# Patient Record
Sex: Male | Born: 1985 | Hispanic: Yes | Smoking: Never smoker
Health system: Southern US, Community
[De-identification: ages and names within clinical notes are randomized; demographics above are authoritative.]

---

## 2015-05-20 ENCOUNTER — Encounter (HOSPITAL_BASED_OUTPATIENT_CLINIC_OR_DEPARTMENT_OTHER): Payer: Self-pay

## 2015-05-20 ENCOUNTER — Emergency Department (HOSPITAL_BASED_OUTPATIENT_CLINIC_OR_DEPARTMENT_OTHER): Payer: Self-pay

## 2015-05-20 ENCOUNTER — Emergency Department (HOSPITAL_BASED_OUTPATIENT_CLINIC_OR_DEPARTMENT_OTHER)
Admission: EM | Admit: 2015-05-20 | Discharge: 2015-05-20 | Disposition: A | Discharge status: Home / Self Care | Payer: Self-pay | Attending: Emergency Medicine | Admitting: Emergency Medicine

## 2015-05-20 DIAGNOSIS — Y9289 Other specified places as the place of occurrence of the external cause: Secondary | ICD-10-CM | POA: Insufficient documentation

## 2015-05-20 DIAGNOSIS — Y9389 Activity, other specified: Secondary | ICD-10-CM | POA: Insufficient documentation

## 2015-05-20 DIAGNOSIS — Z23 Encounter for immunization: Secondary | ICD-10-CM | POA: Insufficient documentation

## 2015-05-20 DIAGNOSIS — W450XXA Nail entering through skin, initial encounter: Secondary | ICD-10-CM | POA: Insufficient documentation

## 2015-05-20 DIAGNOSIS — S91331A Puncture wound without foreign body, right foot, initial encounter: Secondary | ICD-10-CM | POA: Insufficient documentation

## 2015-05-20 DIAGNOSIS — Y998 Other external cause status: Secondary | ICD-10-CM | POA: Insufficient documentation

## 2015-05-20 MED ORDER — TETANUS-DIPHTH-ACELL PERTUSSIS 5-2.5-18.5 LF-MCG/0.5 IM SUSP
0.5000 mL | Freq: Once | INTRAMUSCULAR | Status: AC
  Administered 2015-05-20: 0.5 mL via INTRAMUSCULAR
  Filled 2015-05-20: qty 0.5

## 2015-05-20 MED ORDER — HYDROCODONE-ACETAMINOPHEN 5-325 MG PO TABS: 1.0000 | ORAL_TABLET | ORAL | Status: DC | PRN

## 2015-05-20 MED ORDER — CIPROFLOXACIN HCL 500 MG PO TABS: 500.0000 mg | ORAL_TABLET | Freq: Two times a day (BID) | ORAL | Status: DC

## 2015-05-20 MED ORDER — OXYCODONE-ACETAMINOPHEN 5-325 MG PO TABS
2.0000 | ORAL_TABLET | Freq: Once | ORAL | Status: AC
  Administered 2015-05-20: 2 via ORAL
  Filled 2015-05-20: qty 2

## 2015-05-20 NOTE — ED Notes (Signed)
Pt stepped on a nail at lunchtime that went through his work book on right foot.  Small puncture wound noted, no bleeding currently, pt states he is unable to walk on it.

## 2015-05-20 NOTE — Discharge Instructions (Signed)
1. Medications: cipro, vicodin, usual home medications 2. Treatment: rest, drink plenty of fluids, ice, elevate 3. Follow Up: please followup with your primary doctor for discussion of your diagnoses and further evaluation after today's visit; if you do not have a primary care doctor use the resource guide provided to find one; please return to the ER for severe pain, high fever, worsening swelling, redness, warmth, persistent vomiting   Herida por pinchadura  (Puncture Wound)  La pinchadura ocurre cuando un objeto punzante abre una herida en la piel. Esta herida puede causar una infeccin ya que los grmenes pueden ingresar debajo de la piel durante la lesin.  CUIDADOS EN EL HOGAR   Cambie el vendaje una vez por da o tal como se le indic. Si el vendaje se adhiere, remjelo con agua.  Cumpla con los controles mdicos segn las indicaciones.  Slo debe tomar la medicacin segn las indicaciones.  Tome los medicamentos (antibiticos) tal como se le indic. Tmelos todos, aunque se sienta mejor. Deber aplicarse la vacuna contra el ttanos si:  No recuerda cundo se coloc la vacuna la ltima vez.  Nunca recibi esta vacuna. Si usted necesita aplicarse la vacuna y se niega a recibirla, corre riesgo de contraer ttanos. sta es una enfermedad grave.  Si un Regulatory affairs officer la herida, debe recibir la vacuna contra la rabia.  SOLICITE AYUDA DE INMEDIATO SI:   La herida se ve roja, hinchada o le duele.  Observa lneas rojas [employer], cerca de la herida.  Advierte un olor ftido que proviene de la herida o del vendaje.  Observa una secrecin de color blanco amarillento (pus) en la herida.  El medicamento no le hace efecto.  Nota que hay un objeto en la herida.  Tiene fiebre.  Siente dolor intenso.  Tiene dificultad para respirar.  Se siente dbil o se desvanece (se desmaya).  Comienza avomitar.  Pierde la sensibilidad (adormecimiento) en el brazo, la pierna o no puede  mover el brazo ni la pierna.  Los sntomas empeoran. ASEGRESE DE QUE:   Comprende estas instrucciones.  Controlar su enfermedad.  Solicitar ayuda de inmediato si no mejora o si empeora. Document Released: 04/06/2011 Document Revised: 10/31/2011 [facility] Patient Information 2015 [place], [place]. This information is not intended to replace advice given to you by your health care provider. Make sure you discuss any questions you have with your health care provider.  Cuidados de una herida  (Wound Care)  El cuidado de la herida evita el dolor y la infeccin.  Deber aplicarse la vacuna contra el ttanos si:  No recuerda cundo se coloc la vacuna la ltima vez.  Nunca recibi esta vacuna.  La lesin ha [employer]. Si usted necesita aplicarse la vacuna y se niega a recibirla, corre riesgo de contraer ttanos. sta es una enfermedad grave.  CUIDADOS EN EL HOGAR   Slo tome la medicacin segn las indicaciones.  Limpie la herida [employer] con [place] y [place], segn las indicaciones.  Cambie el (vendaje) tal como se le indic.  Aplique una crema con medicamento sobre la herida, segn le indique el mdico.  Cmbielo si se moja, se ensucia o huele mal.  Puede tomar Shaune Spittle. No debe tomar baos de inmersin, nadar ni hacer nada que haga que la herida se encuentre debajo del agua.  Mantenga elevada y en reposo la zona lesionada hasta que el dolor y la hinchazn mejoren.  Cumpla con los controles mdicos segn las indicaciones. SOLICITE  AYUDA DE INMEDIATO SI:   Observa que un lquido blanco amarillento (pus) aparece en la zona de la herida.  Los medicamentos no Editor, commissioning.  Hay rayas rojas que salen de la herida.  Tiene fiebre. ASEGRESE DE QUE:   Comprende estas instrucciones.  Controlar su enfermedad.  Solicitar ayuda de inmediato si no mejora o si empeora. Document Released: 04/06/2011 Document Revised: 10/31/2011 [facility] Patient  Information 2015 [place], [place]. This information is not intended to replace advice given to you by your health care provider. Make sure you discuss any questions you have with your health care provider.   Emergency Department Resource Guide 1) Find a Doctor and Pay Out of Pocket Although you won't have to find out who is covered by your insurance plan, it is a good idea to ask around and get recommendations. You will then need to call the office and see if the doctor you have chosen will accept you as a new patient and what types of options they offer for patients who are self-pay. Some doctors offer discounts or will set up payment plans for their patients who do not have insurance, but you will need to ask so you aren't surprised when you get to your appointment.  2) Contact Your Local Health Department Not all health departments have doctors that can see patients for sick visits, but many do, so it is worth a call to see if yours does. If you don't know where your local health department is, you can check in your phone book. The CDC also has a tool to help you locate your state's health department, and many state websites also have listings of all of their local health departments.  3) Find a Walk-in Clinic If your illness is not likely to be very severe or complicated, you may want to try a walk in clinic. These are popping up all over the country in pharmacies, drugstores, and shopping centers. They're usually staffed by nurse practitioners or physician assistants that have been trained to treat common illnesses and complaints. They're usually fairly quick and inexpensive. However, if you have serious medical issues or chronic medical problems, these are probably not your best option.  No Primary Care Doctor: - Call Health Connect at  [phone] - they can help you locate a primary care doctor that  accepts your insurance, provides certain services, etc. - Physician Referral Service-  [phone]  Chronic Pain Problems: Organization         Address  Phone   Notes  [facility]  [phone] Patients need to be referred by their primary care doctor.   Medication Assistance: Organization         Address  Phone   Notes  [facility] Medication [facility] [address] [place] [place], [place] [address] [phone] --Must be a resident of [facility] -- Must have NO insurance coverage whatsoever (no Medicaid/ Medicare, etc.) -- The pt. MUST have a primary care doctor that directs their care regularly and follows them in the community   [facility]  [phone]   [employer]  [phone]    Agencies that provide inexpensive medical care: Organization         Address  Phone   Notes  [facility]  [phone]   [facility]    [phone]   [facility] [address] [place], [place] [address] [phone]   [facility] [place] [address] [place] [place] [phone]   Planned Parenthood    [phone]   [facility]    [phone]   [facility]  [address], [place] Phone:  [phone], Fax:  [phone] Hours of Operation:  9 am - 6 pm, M-F.  Also accepts Medicaid/Medicare and self-pay.  [facility]  [address], [place] Phone: [phone], Fax: [phone]. Hours of Operation:  8:30 am - 5:30 pm, M-F.  Also accepts Medicaid and self-pay.  [facility] [place] [address], [place] Phone: [phone]   [facility] [address] Natasha Bence [place], [place] [phone], Ext. [phone] Mondays & Thursdays: 7-9 AM.  First 15 patients are seen on a first come, first serve basis.    Medicaid-accepting [facility] Providers:  Organization         Address  Phone   Notes  [facility] [address],  [place] [phone] Also accepts self-pay patients.  [facility] [address] Laurell Josephs [place], [place]  [phone]   [facility] [address], [place] [phone]   [facility] [address], [place] [phone]   Renaye Rakers [address], [place]   [phone] Only accepts [place] Access [place] patients after they have their name applied to their card.   Self-Pay (no insurance) in [facility]:  Organization         Address  Phone   Notes  Sickle Cell Patients, [facility] Internal Medicine [address] [place], [place] [phone]   [facility] [address] [place], [place] [phone]   [facility] [place]  [address],  [phone]   [facility]/Dr. Osei-Bonsu  [address], [place] or [address], [place] [phone] Phone number for both [place] and [place] locations is the same.  [facility] [address], [place] [phone]   [facility] [address] [place] or [address] [phone]   [facility] [address], [place] [phone], phone; [phone], fax Sees patients 1st and 3rd Saturday of every month.  Must not qualify for public or private insurance (i.e. Medicaid, Medicare, [place] Health Choice, Veterans' Benefits)  Household income should be no more than 200% of the poverty level The clinic cannot treat you if you are pregnant or think you are pregnant  Sexually transmitted diseases are not treated at the clinic.    Dental Care: Organization         Address  Phone  Notes  [facility] Department of [facility] [address] [place], [place] [phone] Accepts children up to age 69 who are enrolled in  [place] or [place] Health Choice; pregnant women with a Medicaid card; and children who have applied for Medicaid or [place] Health Choice, but were declined, whose parents can pay a reduced fee at time of service.  [facility] of [facility]  [address], [place] [phone] Accepts children up to age 40 who are enrolled in [place] or [place] Health Choice; pregnant women with a Medicaid card; and children  who have applied for Medicaid or [place] Health Choice, but were declined, whose parents can pay a reduced fee at time of service.  [facility]  [address] [place], [place] [phone] Patients are seen by appointment only. Walk-ins are not accepted. [facility] will see patients 76 years of age and older. Monday - Tuesday (8am-5pm) Most Wednesdays (8:30-5pm) $30 per visit, cash only  [facility] Adult Dental Access PROGRAM  [address], [facility] [phone] Patients are seen by appointment only. Walk-ins are not accepted. [facility] will see patients 52 years of age and older. One Wednesday Evening (Monthly: Volunteer Based).  $30 per visit, cash only  [employer]  [phone] for adults; Children under age 30, call Graduate Pediatric Dentistry at [phone]. Children aged 98-14, please call [phone] to request a pediatric application.  Dental services are provided in all areas of dental care including fillings, crowns and bridges, complete and partial dentures, implants, gum treatment, root canals, and extractions. Preventive care is also provided. Treatment is provided to both adults and children. Patients are selected via a lottery and there is often a waiting list.   [facility] [address] [place]  [phone] [email or web address]   [facility] [address] [place], [place] [phone], Ext. [phone] Second and Fourth Thursday of each month, opens at 6:30  AM; Clinic ends at 9 AM.  Patients are seen on a first-come first-served basis, and a limited number are seen during each clinic.   [facility]  [address] Ether Griffins [place], [place] [phone]   Eligibility Requirements You must have lived in [place], [place], or [place] counties for at least the last three months.   You cannot be eligible for state or federal sponsored [place], including [employer], [place], or [employer].   You generally cannot be eligible for healthcare insurance through your employer.    How to apply: Eligibility screenings are held every Tuesday and Wednesday afternoon from 1:00 pm until 4:00 pm. You do not need an appointment for the interview!  [facility] [address], [place], [place] [phone]   [facility] Health Department  [phone]   [facility] Health Department  [phone]   [facility]  [phone]    Behavioral Health Resources in the Community: Intensive Outpatient Programs Organization         Address  Phone  Notes  [facility] Services [address] [place], [place] [phone]   [facility] Outpatient [address], [place], [place] [phone]   [facility]: [facility] [address], [place], [place]  [phone]   [facility] Mental Health [address]  [place], [place] [phone] or [phone]   Substance Abuse Resources Organization         Address  Phone  Notes  [facility] Drug Services  [phone]   [facility]  [phone]   [facility]  [phone]   Floydene Flock  [phone]   Residential & Outpatient Substance Abuse Program  [phone]   Psychological Services Organization         Address  Phone  Notes  [facility] Behavioral Health  [phone]   [facility]  [phone]   [facility] Mental Health [address] [place] [phone] or  [phone]    Mobile Crisis Teams Organization  Address  Phone  Notes  [facility], Mobile Crisis Care Unit  [phone]   [facility]  [address] [place], [place] [phone]   [facility] [address] [place] [phone]    Self-Help/Support Groups Organization         Address  Phone             Notes  Mental Health Assoc. of  - variety of support groups  336- I7437963 Call for more information  Narcotics Anonymous (NA), [facility] [address], [employer] [place]  2 meetings at this location   Statistician         Address  Phone  Notes  [facility] [address],    [place]  [phone]   [facility]  [address] [place] [address], [place], [place] [phone]   [facility] [address] [place], [place] [phone] Admissions: 8am-3pm M-F  [facility] [address]    [place], [place] [phone]   [facility] [address] [place], [place], [place] [phone]   [facility] [address]  [place], [place] [phone]   [facility] - Intensive Outpatient [address] Laurell Josephs [address], [place], [place] [phone]   [facility] [address] [place].,  [place], [place] [phone] or [phone]   [facility] ([facility]) [address] [place], [place] [phone] Accepts Medicaid  [facility] [place] [address]  [place] [phone] Substance Abuse/Addiction Treatment   [facility] Organization         Address  Phone  Notes  [facility]  [phone]   Angie Fava, PhD [address] Ervin Knack [place], [place]   [phone] or [phone]   [facility]   [address] [place], [place] [phone]   [facility] [address],  [place], [place] [phone] Insurance/Medicaid/sponsorship through [facility] and Families [address]                                    [place], [place] [phone] Therapy/tele-psych/case  [facility] [address] [place], [place] [phone]    Dr. Lolly Mustache  [phone]   [facility] [place] [facility]  [employer] [facility] Dept. 1) [address], [place] 2) [address], [place] 3)  [address], [place] [phone]  [phone]   [facility] Child Abuse Hotline 787-512-1246 or [phone] (After Hours)

## 2015-05-20 NOTE — ED Provider Notes (Signed)
CSN: [id number]     Arrival date & time 05/20/15  1910 History   First MD Initiated Contact with Patient 05/20/15 1954     Chief Complaint  Patient presents with  . Puncture Wound    HPI   Paul Conway is a 29 y.o. male with no significant past medical history who presents to the ED with puncture wound to his right foot. He states he stepped on a new, clean nail at lunch time, which went through his work boot and punctured his right foot. He reports initially, he did not have pain; however he states when he started to move around, he began to experience pain in his right foot. He also reports mild swelling. He denies fever, chills, chest pain, shortness of breath, abdominal pain, nausea, vomiting, numbness, weakness, paresthesia. He is unsure if his tetanus is up-to-date.   History reviewed. No pertinent past medical history. History reviewed. No pertinent past surgical history. No family history on file. Social History  Substance Use Topics  . Smoking status: None  . Smokeless tobacco: None  . Alcohol Use: None      Review of Systems  Constitutional: Negative for fever and chills.  Respiratory: Negative for shortness of breath.   Cardiovascular: Negative for chest pain.  Gastrointestinal: Negative for nausea, vomiting, abdominal pain and diarrhea.  Musculoskeletal: Positive for myalgias and gait problem.       Reports right foot pain. Reports difficulty walking due to pain. Reports decreased range of motion due to pain.  Skin: Positive for wound.  Neurological: Negative for dizziness, weakness, light-headedness, numbness and headaches.  All other systems reviewed and are negative.     Allergies  Review of patient's allergies indicates no known allergies.  Home Medications   Prior to Admission medications   Medication Sig Start Date End Date Taking? Authorizing Provider  ciprofloxacin (CIPRO) 500 MG tablet Take 1 tablet (500 mg total) by mouth 2 (two) times daily.  05/20/15   Mady Gemma, PA-C  HYDROcodone-acetaminophen (NORCO/VICODIN) 5-325 MG tablet Take 1 tablet by mouth every 4 (four) hours as needed. 05/20/15   Dorise Hiss [place], PA-C     BP 120/67 mmHg  Pulse 88  Temp(Src) 98.8 F (37.1 C) (Oral)  Resp 18  Ht  (1.778 m)  Wt 225 lb (102.059 kg)  BMI 32.28 kg/m2  SpO2 100% Physical Exam  Constitutional: He is oriented to person, place, and time. He appears well-developed and well-nourished. He appears distressed.  Patient in mild distress due to pain.  HENT:  Head: Normocephalic and atraumatic.  Right Ear: External ear normal.  Left Ear: External ear normal.  Nose: Nose normal.  Mouth/Throat: Oropharynx is clear and moist.  Eyes: Conjunctivae and EOM are normal. Pupils are equal, round, and reactive to light. Right eye exhibits no discharge. Left eye exhibits no discharge. No scleral icterus.  Neck: Normal range of motion. Neck supple.  Cardiovascular: Normal rate, regular rhythm and intact distal pulses.   Pulmonary/Chest: Effort normal and breath sounds normal.  Musculoskeletal: Normal range of motion. He exhibits edema and tenderness.  Small puncture wound to plantar aspect of right lateral foot. Mild edema and erythema to dorsal aspect of right lateral foot. Tenderness to palpation of plantar aspect of right lateral foot and dorsal aspect of right lateral foot. Full range of motion of right lower extremity. Strength and sensation intact. Distal pulses intact. Cap refill <3 s.  Neurological: He is alert and oriented to person, place, and time.  He has normal strength. No sensory deficit.  Skin: Skin is warm and dry. No rash noted. He is not diaphoretic. There is erythema. No pallor.  Psychiatric: He has a normal mood and affect. His behavior is normal. Judgment and thought content normal.  Nursing note and vitals reviewed.   ED Course  Procedures (including critical care time)  Labs Review Labs Reviewed - No data to  display  Imaging Review Dg Foot Complete Right  05/20/2015   CLINICAL DATA:  Stepped on nail. Plantar foot puncture wound. Pain. Initial encounter.  EXAM: RIGHT FOOT COMPLETE - 3+ VIEW  COMPARISON:  None.  FINDINGS: There is no evidence of fracture or dislocation. There is no evidence of arthropathy or other focal bone abnormality. Soft tissues are unremarkable. No evidence of radiopaque foreign body.  IMPRESSION: Negative.   Electronically Signed   By: Myles Rosenthal M.D.   On: 05/20/2015 19:56   I have personally reviewed and evaluated these images as part of my medical decision-making.   EKG Interpretation None      MDM   Final diagnoses:  Puncture wound of foot, right, initial encounter    29 year old male presents with puncture wound to the plantar aspect of his right foot after stepping on a new, clean nail around lunchtime today. He reports pain, swelling, and difficulty walking. He denies numbness, weakness, paresthesia, fever, chills.  Patient is afebrile with stable vital signs. Small puncture wound to plantar aspect of right lateral foot, hemostatic. Mild edema and erythema to dorsal aspect of right lateral foot. TTP of plantar and dorsal aspect of right lateral foot. Full range of motion. Strength and sensation intact. Distal pulses intact. Cap refill <3 seconds.  Tetanus updated in the ED. Wound cleaned and dressed. Pain controlled with percocet. Feel patient is stable for discharge at this time. Will treat with ciprofloxacin for infection prophylaxis and pain medicine. Post-op shoe given for support while ambulating. Patient to follow-up for wound re-check in 2 days. Return precautions discussed at length. Patient in agreement with plan.  BP 120/67 mmHg  Pulse 88  Temp(Src) 98.8 F (37.1 C) (Oral)  Resp 18  Ht  (1.778 m)  Wt 225 lb (102.059 kg)  BMI 32.28 kg/m2  SpO2 100%     Mady Gemma, PA-C 05/21/15 [address] [place], [place] 05/21/15  1045  Arby Barrette, MD 06/03/15 [phone]

## 2015-06-20 ENCOUNTER — Emergency Department (HOSPITAL_BASED_OUTPATIENT_CLINIC_OR_DEPARTMENT_OTHER)
Admission: EM | Admit: 2015-06-20 | Discharge: 2015-06-20 | Disposition: A | Discharge status: Home / Self Care | Payer: Self-pay | Attending: Emergency Medicine | Admitting: Emergency Medicine

## 2015-06-20 ENCOUNTER — Encounter (HOSPITAL_BASED_OUTPATIENT_CLINIC_OR_DEPARTMENT_OTHER): Payer: Self-pay | Admitting: *Deleted

## 2015-06-20 DIAGNOSIS — R079 Chest pain, unspecified: Secondary | ICD-10-CM | POA: Insufficient documentation

## 2015-06-20 DIAGNOSIS — Z792 Long term (current) use of antibiotics: Secondary | ICD-10-CM | POA: Insufficient documentation

## 2015-06-20 DIAGNOSIS — K625 Hemorrhage of anus and rectum: Secondary | ICD-10-CM | POA: Insufficient documentation

## 2015-06-20 LAB — CBC WITH DIFFERENTIAL/PLATELET
BASOS ABS: 0 10*3/uL (ref 0.0–0.1)
Basophils Relative: 0 %
EOS PCT: 4 %
Eosinophils Absolute: 0.3 10*3/uL (ref 0.0–0.7)
HCT: 41.3 % (ref 39.0–52.0)
HEMOGLOBIN: 13.7 g/dL (ref 13.0–17.0)
LYMPHS ABS: 2.2 10*3/uL (ref 0.7–4.0)
LYMPHS PCT: 26 %
MCH: 27.8 pg (ref 26.0–34.0)
MCHC: 33.2 g/dL (ref 30.0–36.0)
MCV: 83.9 fL (ref 78.0–100.0)
MONO ABS: 1 10*3/uL (ref 0.1–1.0)
MONOS PCT: 12 %
NEUTROS ABS: 5 10*3/uL (ref 1.7–7.7)
Neutrophils Relative %: 59 %
PLATELETS: 191 10*3/uL (ref 150–400)
RBC: 4.92 MIL/uL (ref 4.22–5.81)
RDW: 13.9 % (ref 11.5–15.5)
WBC: 8.6 10*3/uL (ref 4.0–10.5)

## 2015-06-20 LAB — OCCULT BLOOD X 1 CARD TO LAB, STOOL: FECAL OCCULT BLD: NEGATIVE

## 2015-06-20 MED ORDER — DOCUSATE SODIUM 100 MG PO CAPS: 100.0000 mg | ORAL_CAPSULE | Freq: Two times a day (BID) | ORAL | Status: DC

## 2015-06-20 NOTE — ED Provider Notes (Signed)
CSN: [id number]     Arrival date & time 06/20/15  1312 History   First MD Initiated Contact with Patient 06/20/15 1329     Chief Complaint  Patient presents with  . Rectal Bleeding     (Consider location/radiation/quality/duration/timing/severity/associated sxs/prior Treatment) The history is provided by the patient and a relative. The history is limited by a language barrier. A language interpreter was used.     Sriansh Farra is a 29 y.o. male, pt with no pertinent past medical history, presents with scant bright red blood after wiping following bowel movements for the past two weeks. Pain with bleeding, burning/itching, 4/10, localized to rectum, and non-radiating. Denies new medications, N/V/C/D, fever/chills, abdominal pain, or any other pain or complaints.  Girlfriend, Marylene Land, at bedside acted as Nurse, learning disability.     History reviewed. No pertinent past medical history. History reviewed. No pertinent past surgical history. No family history on file. Social History  Substance Use Topics  . Smoking status: Never Smoker   . Smokeless tobacco: Never Used  . Alcohol Use: Yes     Comment: 7 beers/ daily    Review of Systems  Constitutional: Negative for fever, chills, diaphoresis and unexpected weight change.  Respiratory: Negative for cough, chest tightness and shortness of breath.   Cardiovascular: Positive for chest pain. Negative for palpitations and leg swelling.  Gastrointestinal: Negative for nausea, vomiting, abdominal pain, diarrhea and constipation.  Genitourinary: Negative for dysuria and flank pain.  Musculoskeletal: Negative for back pain.  Skin: Negative for color change and pallor.  Neurological: Negative for dizziness, syncope, weakness and light-headedness.  All other systems reviewed and are negative.     Allergies  Review of patient's allergies indicates no known allergies.  Home Medications   Prior to Admission medications   Medication Sig Start Date End  Date Taking? Authorizing Provider  ciprofloxacin (CIPRO) 500 MG tablet Take 1 tablet (500 mg total) by mouth 2 (two) times daily. 05/20/15   Mady Gemma, PA-C  docusate sodium (COLACE) 100 MG capsule Take 1 capsule (100 mg total) by mouth every 12 (twelve) hours. 06/20/15   Shawn C Joy, PA-C  HYDROcodone-acetaminophen (NORCO/VICODIN) 5-325 MG tablet Take 1 tablet by mouth every 4 (four) hours as needed. 05/20/15   Mady Gemma, PA-C   BP 122/71 mmHg  Pulse 71  Temp(Src) 98 F (36.7 C) (Oral)  Resp 18  Ht  (1.702 m)  Wt 242 lb 2 oz (109.827 kg)  BMI 37.91 kg/m2  SpO2 100% Physical Exam  Constitutional: He appears well-developed and well-nourished. No distress.  HENT:  Head: Normocephalic and atraumatic.  Eyes: Conjunctivae are normal. Pupils are equal, round, and reactive to light.  Cardiovascular: Normal rate, regular rhythm and normal heart sounds.   Pulmonary/Chest: Effort normal and breath sounds normal. No respiratory distress.  Abdominal: Soft. Bowel sounds are normal.  Genitourinary: Prostate normal. Rectal exam shows tenderness. Rectal exam shows no external hemorrhoid, no mass and anal tone normal.  RN acted as chaperone for rectal exam. Pt exhibited tenderness to palpation during exam, but states it was more uncomfortable than really painful. No gross blood or stool. Erythema present to anus and surrounding tissue.   Musculoskeletal: He exhibits no edema or tenderness.  Neurological: He is alert.  Skin: Skin is warm and dry. He is not diaphoretic.  Nursing note and vitals reviewed.   ED Course  Procedures (including critical care time) Labs Review Labs Reviewed  CBC WITH DIFFERENTIAL/PLATELET  OCCULT BLOOD X 1 CARD  TO LAB, STOOL    Imaging Review No results found. I have personally reviewed and evaluated these images and lab results as part of my medical decision-making.   EKG Interpretation None      MDM   Final diagnoses:  Bright red  rectal bleeding    Domingo MadeiraReynaldo Sappenfield presents with scant rectal bleeding and pain for the past two weeks with no accompanying symptoms.   Pt shows no signs or symptoms that would indicate imaging at this time. Recommend pt follow up with a PCP on this matter. Pt given colace prescription upon discharge. Communicated plan of care to pt, who agreed to the plan. Hemocult negative. CBC without abnormalities.   Anselm PancoastShawn C Joy, PA-C 06/20/15 1452  Pricilla LovelessScott Goldston, MD 06/21/15 [phone]

## 2015-06-20 NOTE — ED Notes (Signed)
Patient stable and ambulatory.  Patient verbalizes understanding of discharge instructions and medications. 

## 2015-06-20 NOTE — ED Notes (Signed)
Pt reports BRB rectal bleeding with BM x 1week- rectal pain x 2 weeks

## 2015-06-20 NOTE — Discharge Instructions (Signed)
You have been seen today for rectal bleeding and pain. Your lab tests showed no abnormalities. Follow up with PCP on this matter. A resource guide is attached to help you choose a PCP. Return to ED should symptoms worsen.   Emergency Department Resource Guide 1) Find a Doctor and Pay Out of Pocket Although you won't have to find out who is covered by your insurance plan, it is a good idea to ask around and get recommendations. You will then need to call the office and see if the doctor you have chosen will accept you as a new patient and what types of options they offer for patients who are self-pay. Some doctors offer discounts or will set up payment plans for their patients who do not have insurance, but you will need to ask so you aren't surprised when you get to your appointment.  2) Contact Your Local Health Department Not all health departments have doctors that can see patients for sick visits, but many do, so it is worth a call to see if yours does. If you don't know where your local health department is, you can check in your phone book. The CDC also has a tool to help you locate your state's health department, and many state websites also have listings of all of their local health departments.  3) Find a Walk-in Clinic If your illness is not likely to be very severe or complicated, you may want to try a walk in clinic. These are popping up all over the country in pharmacies, drugstores, and shopping centers. They're usually staffed by nurse practitioners or physician assistants that have been trained to treat common illnesses and complaints. They're usually fairly quick and inexpensive. However, if you have serious medical issues or chronic medical problems, these are probably not your best option.  No Primary Care Doctor: - Call Health Connect at  [phone] - they can help you locate a primary care doctor that  accepts your insurance, provides certain services, etc. - Physician Referral  Service- [phone]  Chronic Pain Problems: Organization         Address  Phone   Notes  [facility]  [phone]) [phone] Patients need to be referred by their primary care doctor.   Medication Assistance: Organization         Address  Phone   Notes  [facility] Medication [facility] [address] [place], [place] [address] [phone]) [phone] --Must be a resident of [facility] [place] -- Must have NO insurance coverage whatsoever (no Medicaid/ Medicare, etc.) -- The pt. MUST have a primary care doctor that directs their care regularly and follows them in the community   MedAssist  [phone]) [phone]   [employer]  [phone]    Agencies that provide inexpensive medical care: Organization         Address  Phone   Notes  [facility]  [phone]   [facility]    [phone]   [facility] [address] [place], [place] [address] [phone]) [phone]   [facility] [address], [place] [phone]) [phone]   [facility]anned Parenthood    [phone]) [phone]   [facility]    [phone]) [phone]   [facility] and [facility]  [address], [place] Phone:  [phone], Fax:  [phone] Hours of Operation:  9 am - 6 pm, M-F.  Also accepts Medicaid/Medicare and  self-pay.  [facility]  [address], [place] Phone: [phone], Fax: [phone]) [phone]. Hours of Operation:  8:30 am - 5:30 pm, M-F.  Also accepts Medicaid and self-pay.  [facility] [place] [address], [place] Phone: [phone]) [phone]   [facility] [address], [place], [place] [phone], Ext. [phone] Mondays & Thursdays: 7-9 AM.  First 15 patients are seen on a first come, first serve basis.    Medicaid-accepting [facility] Providers:  Organization         Address  Phone   Notes  [facility] [address], [place] [phone]) [phone] Also accepts self-pay patients.  [facility] [address], [place]  [phone]) [phone]   [facility] [address], [place] [phone]) [phone]   [facility] [address] [place] [address], [place] [phone]) [phone]   Renaye RakersVeita Bland [address], [place]   [phone]) [phone] Only accepts [place] Access [place] patients after they have their name applied to their card.   Self-Pay (no insurance) in [facility]:  Organization         Address  Phone   Notes  Sickle Cell Patients, [facility] [address], [place] [phone]   [facility] [address], [place] [phone]   [facility]  [address], [place] [phone]) [phone]   [facility]/Dr. Osei-Bonsu  [address] [place] [address], [place] or [phone], [place] [phone]) [phone] Phone number for both [place] and [place] locations is the same.  [facility] [address], [place] [phone]) [phone]   [facility] [place] [address] [place] [address], [place] or [address] [phone]) [phone]) [phone]   [facility] [address], [place] [phone](336) [phone], phone; [phone]) [phone], fax Sees patients 1st and 3rd Saturday of every month.  Must not qualify for public or private insurance (i.e. Medicaid, Medicare, [place] Health Choice, Veterans' Benefits)  Household income should be no more than 200% of the poverty level The clinic cannot treat you if you are pregnant or think you are pregnant  Sexually transmitted diseases are not treated at the clinic.    Dental Care: Organization         Address  Phone  Notes  [facility] Department of [facility] [address], [place] [phone] Accepts children up to age 31 who are enrolled in  [place] or [place] Health Choice; pregnant women with a Medicaid card; and children who have applied for Medicaid or [place] Health Choice, but were declined, whose parents can pay a reduced fee at time of service.  [facility] Department of [facility] [place]  [address], [place] [phone]) [phone] Accepts children up to age 31 who are enrolled in [place] or [place] Health Choice; pregnant women with a Medicaid card; and children who have applied for Medicaid or  Health Choice, but were declined, whose parents can pay a reduced fee at time of service.  [facility]  [address], [place] [phone] Patients are seen by appointment only. Walk-ins are not accepted. [facility] will see patients 31 years of age and older. Monday - Tuesday (8am-5pm) Most Wednesdays (8:30-5pm) $  30 per visit, cash only  [facility] [employer] PROGRAM  [address], [facility] [phone] Patients are seen by appointment only. Walk-ins are not accepted. [place] will see patients 66 years of age and older. One Wednesday Evening (Monthly: Volunteer Based).  $30 per visit, cash only  [place]  [phone] for adults; Children under age 85, call Graduate Pediatric Dentistry at [phone]. Children aged 31-14, please call [phone] to request a pediatric application.  Dental services are provided in all areas of dental care including fillings, crowns and bridges, complete and partial dentures, implants, gum treatment, root canals, and extractions. Preventive care is also provided. Treatment is provided to both adults and children. Patients are selected via a lottery and there is often a waiting list.   [facility] [address]  [phone] [email or web address]   [facility] [address] [place], [place] [phone], Ext. [phone] Second and Fourth Thursday of each month, opens at 6:30  AM; Clinic ends at 9 AM.  Patients are seen on a first-come first-served basis, and a limited number are seen during each clinic.   [facility]  [address] Danker [place], [place] [phone]   Eligibility Requirements You must have lived in [place], [place], or [place] counties for at least the last three months.   You cannot be eligible for state or federal sponsored [employer], including [employer], [place], or [employer].   You generally cannot be eligible for healthcare insurance through your employer.    How to apply: Eligibility screenings are held every Tuesday and Wednesday afternoon from 1:00 pm until 4:00 pm. You do not need an appointment for the interview!  [facility] [address] [place], [place]   [place] Department  [place]  [phone]    Behavioral Health Resources in the Community: Intensive Outpatient Programs Organization         Address  Phone  Notes  [place]. [address] [place], [place] [phone]   [facility] [address] [place], [place]   ADS: Alcohol & Drug Svcs [address], [place], [place]   [place] [address],  [place], [place] or [phone]   Substance Abuse Resources Organization         Address  Phone  Notes  Alcohol and Drug Services  [phone]   [place]  [phone]   [place]   Chinita Pester  [phone]   Residential & Outpatient Substance Abuse Program  [phone]   Psychological Services Organization         Address  Phone  Notes  [facility] [place]  [place]  [phone]   [place] [address] [place] or  [phone]    Mobile Crisis Teams Organization         Address  Phone  Notes  [facility]rapeutic Alternatives, Mobile Crisis Care Unit  [phone]   Assertive Psychotherapeutic Services  [address] [place], [place]   [place] Levels [address], [place] [phone]    Self-Help/Support Groups Organization         Address  Phone  Notes  Mental Health Assoc. of [place] - variety of support groups  [place] Call for more information  Narcotics Anonymous (NA), Caring [facility] [address], [employer] [place]  2 meetings at this location   Special educational needs teacher         Address  Phone  Notes  ASAP Residential Treatment [place],    [place]  [phone]   [facility]  [address] [place] [id number], [place], [place]   [place], [place] [phone] Admissions: 8am-3pm M-F  Incentives Substance [place] [address]    [place], [place] [phone]   [facility] [address] [place], [place], [place]   [facility] [address],  [place], [place]   Insight Programs - Intensive Outpatient [address], [place], [place]   [facility] ([place] [address].,  [place], [place] [phone] or [phone]   [facility]idential Treatment Services (RTS) [address] [place], [place] Accepts Medicaid  [facility] [place] [address],  [place] [phone] Substance Abuse/Addiction Treatment   [facility] Organization         Address  Phone  Notes  [facility]  [phone]   Domenic Schwab, PhD [address]lis Porta [place], [place]   [phone] or [phone]   [place] and [place], [place] [phone]   [facility] [address],  [place], [place] [phone] Insurance/Medicaid/sponsorship through [facility] and Families [address] [place] [address], [address] [place]                                    [place], [place] [phone] [place] [address], [place] [phone]    Dr. Adele Schilder  [phone]   [facility] Dept. 1) [address], [place] 2) [place] 3)  [place] [address], [place] [phone]  [phone]   [place] [phone] or [phone] (After Hours)

## 2015-07-20 ENCOUNTER — Encounter (HOSPITAL_BASED_OUTPATIENT_CLINIC_OR_DEPARTMENT_OTHER): Payer: Self-pay

## 2015-07-20 ENCOUNTER — Emergency Department (HOSPITAL_BASED_OUTPATIENT_CLINIC_OR_DEPARTMENT_OTHER)
Admission: EM | Admit: 2015-07-20 | Discharge: 2015-07-21 | Disposition: A | Discharge status: Home / Self Care | Payer: Self-pay | Attending: Emergency Medicine | Admitting: Emergency Medicine

## 2015-07-20 DIAGNOSIS — H00016 Hordeolum externum left eye, unspecified eyelid: Secondary | ICD-10-CM

## 2015-07-20 DIAGNOSIS — H00015 Hordeolum externum left lower eyelid: Secondary | ICD-10-CM | POA: Insufficient documentation

## 2015-07-20 MED ORDER — ERYTHROMYCIN 5 MG/GM OP OINT: TOPICAL_OINTMENT | OPHTHALMIC | Status: DC

## 2015-07-20 NOTE — ED Provider Notes (Signed)
CSN: [id number]     Arrival date & time 07/20/15  1914 History   First MD Initiated Contact with Patient 07/20/15 2259     Chief Complaint  Patient presents with  . Eye Problem     (Consider location/radiation/quality/duration/timing/severity/associated sxs/prior Treatment) HPI   Domingo MadeiraReynaldo Gebhardt is a 29 y.o. male, patient with no pertinent past medical history, presenting to the ED with two weeks of what he calls a stye on his left lower eye lid. Pt wanted to make sure he didn't have something more wrong. Denies vision changes. Pt states his pain is minor, throbbing, 1/10, non-radiating. Patient has tried some warm compresses intermittently with some relief of discomfort. Patient's girlfriend is at the bedside and translates for the patient.    History reviewed. No pertinent past medical history. History reviewed. No pertinent past surgical history. No family history on file. Social History  Substance Use Topics  . Smoking status: Never Smoker   . Smokeless tobacco: Never Used  . Alcohol Use: Yes     Comment: 7 beers/ daily    Review of Systems  Eyes: Positive for pain.      Allergies  Review of patient's allergies indicates no known allergies.  Home Medications   Prior to Admission medications   Medication Sig Start Date End Date Taking? Authorizing Provider  erythromycin ophthalmic ointment Place a 1/2 inch ribbon of ointment into the lower eyelid. 07/20/15   Kieren Adkison C Zerina Hallinan, PA-C   BP 123/79 mmHg  Pulse 83  Temp(Src) 98.2 F (36.8 C) (Oral)  Resp 18  Ht 5\' 8"  (1.727 m)  Wt 109.77 kg  BMI 36.80 kg/m2  SpO2 98% Physical Exam  Constitutional: He appears well-developed and well-nourished.  HENT:  Head: Normocephalic and atraumatic.  Eyes: Conjunctivae and EOM are normal. Pupils are equal, round, and reactive to light.  Patient has some minor swelling to the medial side of the lower lid. No fluctuance. The tear duct appears patent. Globe is normal.    Visual  Acuity  Right Eye Distance:   Left Eye Distance:   Bilateral Distance:    Right Eye Near: R Near: 20/25 Left Eye Near:  L Near: 20/20 Bilateral Near:  20/20     ED Course  Procedures (including critical care time) Labs Review Labs Reviewed - No data to display  Imaging Review No results found. I have personally reviewed and evaluated these images and lab results as part of my medical decision-making.   EKG Interpretation None      MDM   Final diagnoses:  Stye, left    Domingo MadeiraReynaldo Rathert presents with an area of swelling of the lower left eyelid.  Findings and plan of care discussed with Vanetta MuldersScott Zackowski, MD.  This is a hordeolum and the treatment is warm compresses. Patient will be getting the erythromycin ointment as well. If this is not better in a week patient will follow up with ophthalmology. These instructions and the plan of care were explained to the patient, who voiced understanding and agreed to the plan.  Anselm PancoastShawn C Lennan Malone, PA-C 07/21/15 1652

## 2015-07-20 NOTE — ED Provider Notes (Signed)
Medical screening examination/treatment/procedure(s) were conducted as a shared visit with non-physician practitioner(s) and myself.  I personally evaluated the patient during the encounter.   EKG Interpretation None      Patient seen by me. 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 Patient nontoxic no acute distress. Will continue warm compresses will use erythromycin ophthalmic ointment and will give referral information to ophthalmology. Patient does not need to follow-up ophthalmology right away can give this another week but referral information provided. Sclera otherwise without any significant abnormalities anterior chambers normal pupils are equal and reactive to light extraocular muscles are intact. Chalazion is on the left lower lid towards the medial aspect measures about 5 mm in size.  Vanetta MuldersScott Magaby Rumberger, MD 07/20/15 [phone]

## 2015-07-20 NOTE — ED Notes (Addendum)
C/o stye area to left lower lid x approx 2 weeks-started after taking insulation out of home-NAD-pt speaks some english-girlfriend interpreting

## 2015-07-20 NOTE — Discharge Instructions (Signed)
You have been seen today for a stye. Follow up with PCP as needed. Return to ED should symptoms worsen. Apply warm compresses to the area as well as the erythromycin ointment for one week. If this is not improved, follow-up with ophthalmology.   Emergency Department Resource Guide 1) Find a Doctor and Pay Out of Pocket Although you won't have to find out who is covered by your insurance plan, it is a good idea to ask around and get recommendations. You will then need to call the office and see if the doctor you have chosen will accept you as a new patient and what types of options they offer for patients who are self-pay. Some doctors offer discounts or will set up payment plans for their patients who do not have insurance, but you will need to ask so you aren't surprised when you get to your appointment.  2) Contact Your Local Health Department Not all health departments have doctors that can see patients for sick visits, but many do, so it is worth a call to see if yours does. If you don't know where your local health department is, you can check in your phone book. The CDC also has a tool to help you locate your state's health department, and many state websites also have listings of all of their local health departments.  3) Find a Walk-in Clinic If your illness is not likely to be very severe or complicated, you may want to try a walk in clinic. These are popping up all over the country in pharmacies, drugstores, and shopping centers. They're usually staffed by nurse practitioners or physician assistants that have been trained to treat common illnesses and complaints. They're usually fairly quick and inexpensive. However, if you have serious medical issues or chronic medical problems, these are probably not your best option.  No Primary Care Doctor: - Call Health Connect at  [phone] - they can help you locate a primary care doctor that  accepts your insurance, provides certain services,  etc. - Physician Referral Service- [phone]  Chronic Pain Problems: Organization         Address  Phone   Notes  [facility]  [phone]) [phone] Patients need to be referred by their primary care doctor.   Medication Assistance: Organization         Address  Phone   Notes  [facility] [address] [place], [place] [address] [phone]) [phone] --Must be a resident of [place] -- Must have NO insurance coverage whatsoever (no Medicaid/ Medicare, etc.) -- The pt. MUST have a primary care doctor that directs their care regularly and follows them in the community   MedAssist  [phone]   [employer]  [phone]    Agencies that provide inexpensive medical care: Organization         Address  Phone   Notes  [facility]  [phone]   [facility]    [phone]   [facility] [address] [place], [place] [address] [phone]   [facility] [address], [place] [phone]   [facility]anned [facility]    [phone](336) [phone]   [facility]    [phone]   [facility]  [address], [place] Phone:  [phone]) [phone], Fax:  [phone] Hours of Operation:  9 am - 6 pm, M-F.  Also  accepts Medicaid/Medicare and self-pay.  [facility] for [place], [address], [place] Phone: [phone], Fax: [phone]. Hours of Operation:  8:30 am - 5:30 pm, M-F.  Also accepts Medicaid and self-pay.  [facility] [place] [address] [place] Phone: [phone]   [place], [place], [place] [phone], Ext. [phone] Mondays & Thursdays: 7-9 AM.  First 15 patients are seen on a first come, first serve basis.    [place] Providers:  Organization         Address  Phone   Notes  [facility] [address], [place] [phone] Also accepts self-pay patients.  [facility] [address], [place]  [phone]   [place], [address], [place] [phone]   [facility] [address] [place] [phone]   Lucianne Lei [address], [place]   [phone] Only accepts [place] Access [place] patients after they have their name applied to their card.   Self-Pay (no insurance) in [facility]:  Organization         Address  Phone   Notes  Sickle Cell Patients, [facility] [place] [phone]   [facility] [place] [phone]   [facility] [place]  [place], [place], [place] [phone]   [facility]/Dr. Osei-Bonsu  [address], [place] or [address], [place] [phone] Phone number for both [place] and [place] locations is the same.  [facility] [address], [place] [phone]   [facility] [address] [place] or [address] [phone]   [facility] [address], [place] [phone], phone; [phone], fax Sees patients 1st and 3rd Saturday of every month.  Must not qualify for public or private insurance (i.e. Medicaid, Medicare, [place] Health Choice, Veterans' Benefits)  Household income should be no more than 200% of the poverty level The clinic cannot treat you if you are pregnant or think you are pregnant  Sexually transmitted diseases are not treated at the clinic.    Dental Care: Organization         Address  Phone  Notes  [facility] of [facility] [place] [phone] Accepts children up to  age 73 who are enrolled in [place] or [place]; pregnant women with a Medicaid card; and children who have applied for Medicaid or [place] Health Choice, but were declined, whose parents can pay a reduced fee at time of service.  [facility] Department of [facility]  [address], [place] [phone] Accepts children up to age 74 who are enrolled in [place] or [place]; pregnant women with a Medicaid card; and children who have applied for Medicaid or  Health Choice, but were declined, whose parents can pay a reduced fee at time of service.  [facility] Access PROGRAM  [place] [phone] Patients are seen by appointment only. Walk-ins are not accepted. [place] will see patients 74 years of age and older. Monday - Tuesday (8am-5pm)  Most Wednesdays (8:30-5pm) $30 per visit, cash only  [facility] Adult [employer] PROGRAM  [address], [facility] [phone] Patients are seen by appointment only. Walk-ins are not accepted. [place] will see patients 23 years of age and older. One Wednesday Evening (Monthly: Volunteer Based).  $30 per visit, cash only  [place]  [phone] for adults; Children under age 57, call Graduate Pediatric Dentistry at [phone]. Children aged 71-14, please call [phone] to request a pediatric application.  Dental services are provided in all areas of dental care including fillings, crowns and bridges, complete and partial dentures, implants, gum treatment, root canals, and extractions. Preventive care is also provided. Treatment is provided to both adults and children. Patients are selected via a lottery and there is often a waiting list.   [facility] [address] [place]  [phone] [email or web address]   [facility] [address] [place], [place] [phone], Ext. [phone] Second and Fourth Thursday of  each month, opens at 6:30 AM; Clinic ends at 9 AM.  Patients are seen on a first-come first-served basis, and a limited number are seen during each clinic.   [facility] [place]  [address] Hillard Danker [place], [place] [phone]   Eligibility Requirements You must have lived in [place], [place], or [place] counties for at least the last three months.   You cannot be eligible for state or federal sponsored [employer], including [employer], [place], or [employer].   You generally cannot be eligible for healthcare insurance through your employer.    How to apply: Eligibility screenings are held every Tuesday and Wednesday afternoon from 1:00 pm until 4:00 pm. You do not need an appointment for the interview!  [facility] [address] [place], [place]   [place]  [place] Department  [place]  [phone]    Behavioral Health Resources in the Community: Intensive Outpatient Programs Organization         Address  Phone  Notes  [facility] [place]. [address], [place], [place] [phone]   [facility] [address], [place], [place]   ADS: Alcohol & Drug Svcs [address] [place], [place]   [place] [address],  [place], [place] or [phone]   Substance Abuse Resources Organization         Address  Phone  Notes  Alcohol and Drug Services  [phone]   [place]  [phone]   The [place]   Chinita Pester  [phone]   Residential & Outpatient Substance Abuse Program  [phone]   Psychological Services Organization         Address  Phone  Notes  [facility]  [place]  [phone]   [place] [address],  [place] or [phone]    Mobile Crisis Teams Organization         Address  Phone  Notes  [facility], Mobile Crisis Care Unit  [phone]   [facility]  [address] [place], [place]   [place] [address] [place] [phone]    Self-Help/Support Groups Organization         Address  Phone  Notes  [facility]ental [facility] of [place] - variety of support groups  [place] Call for more information  Narcotics Anonymous (NA), [facility]aring [facility] [address], [employer] [place]  2 meetings at this location   Special educational needs teacher         Address  Phone  Notes  [facility]AP Residential Treatment [place],    [place]  [phone]   [facility]  [address], [place] [phone], [place], [place]   [place], [place] [phone] Admissions: 8am-3pm M-F  Incentives Substance [place] [address],    [place], [place] [phone]   [facility] [address] [place], [place], [place]   [facility] [address]  [place], [place]   [facility] - Intensive Outpatient [address], [place], [place]   [facility] ([place].,  [place], [place] [phone] or [phone]   [facility] Services (RTS) [address] [place], [place] Accepts Medicaid  [facility] [place] [address],  [place] [phone] Substance Abuse/Addiction Treatment   [facility] Organization         Address  Phone  Notes  [facility]  [phone]   Domenic Schwab, PhD [address] Arlis Port[place], [place]   [phone] or [phone]   [place], [place] [phone]     [facility] [address], [place], [place] [phone] Insurance/Medicaid/sponsorship through [facility] and Families [address], [address]                                    [place], [place] [phone] [place] [address], [place] [phone]    Dr. Adele Schilder  [phone]   [facility] Dept. 1) [address], [place] 2) [place] 3)  [place] [address], [place] [phone]  [phone]   [place] [phone] or [phone] (After Hours)

## 2016-08-03 ENCOUNTER — Emergency Department (HOSPITAL_BASED_OUTPATIENT_CLINIC_OR_DEPARTMENT_OTHER)
Admission: EM | Admit: 2016-08-03 | Discharge: 2016-08-03 | Disposition: A | Discharge status: Home / Self Care | Payer: Self-pay | Attending: Emergency Medicine | Admitting: Emergency Medicine

## 2016-08-03 ENCOUNTER — Encounter (HOSPITAL_BASED_OUTPATIENT_CLINIC_OR_DEPARTMENT_OTHER): Payer: Self-pay

## 2016-08-03 DIAGNOSIS — Y999 Unspecified external cause status: Secondary | ICD-10-CM | POA: Insufficient documentation

## 2016-08-03 DIAGNOSIS — Y9389 Activity, other specified: Secondary | ICD-10-CM | POA: Insufficient documentation

## 2016-08-03 DIAGNOSIS — S0502XA Injury of conjunctiva and corneal abrasion without foreign body, left eye, initial encounter: Secondary | ICD-10-CM | POA: Insufficient documentation

## 2016-08-03 DIAGNOSIS — X58XXXA Exposure to other specified factors, initial encounter: Secondary | ICD-10-CM | POA: Insufficient documentation

## 2016-08-03 DIAGNOSIS — Y929 Unspecified place or not applicable: Secondary | ICD-10-CM | POA: Insufficient documentation

## 2016-08-03 MED ORDER — HYDROCODONE-ACETAMINOPHEN 5-325 MG PO TABS: ORAL_TABLET | ORAL | 0 refills | Status: AC

## 2016-08-03 MED ORDER — ERYTHROMYCIN 5 MG/GM OP OINT
TOPICAL_OINTMENT | Freq: Once | OPHTHALMIC | Status: AC
  Administered 2016-08-03: 16:00:00 via OPHTHALMIC
  Filled 2016-08-03: qty 3.5

## 2016-08-03 MED ORDER — TETRACAINE HCL 0.5 % OP SOLN
1.0000 [drp] | Freq: Once | OPHTHALMIC | Status: AC
  Administered 2016-08-03: 1 [drp] via OPHTHALMIC
  Filled 2016-08-03: qty 4

## 2016-08-03 MED ORDER — HYDROCODONE-ACETAMINOPHEN 5-325 MG PO TABS
1.0000 | ORAL_TABLET | Freq: Once | ORAL | Status: AC
  Administered 2016-08-03: 1 via ORAL
  Filled 2016-08-03: qty 1

## 2016-08-03 MED ORDER — FLUORESCEIN SODIUM 0.6 MG OP STRP
1.0000 | ORAL_STRIP | Freq: Once | OPHTHALMIC | Status: AC
  Administered 2016-08-03: 1 via OPHTHALMIC
  Filled 2016-08-03: qty 1

## 2016-08-03 MED FILL — HYDROCODON-APAP 5-325: 5-325 | 2 days supply | Qty: 8 | Fill #0

## 2016-08-03 NOTE — Discharge Instructions (Signed)
Please read and follow all provided instructions.  Your diagnoses today include:  1. Abrasion of left cornea, initial encounter     Tests performed today include:  Visual acuity testing to check your vision  Fluorescein dye examination to look for scratches on your eye  Vital signs. See below for your results today.   Medications prescribed:   Erythromycin  - antibiotic eye ointment  Use this medication as follows:  Apply 1/4" of the antibiotic ointment to affected eye up to 6 times a day while awake for 7 days   Vicodin (hydrocodone/acetaminophen) - narcotic pain medication  DO NOT drive or perform any activities that require you to be awake and alert because this medicine can make you drowsy. BE VERY CAREFUL not to take multiple medicines containing Tylenol (also called acetaminophen). Doing so can lead to an overdose which can damage your liver and cause liver failure and possibly death.  Take any prescribed medications only as directed.  Home care instructions:  Follow any educational materials contained in this packet.  You have a scratch of the eye on the cornea (the clear part of the eye). This condition may be caused by trauma. It is a common problem for people who wear contact lenses. Proper treatment is important. No evidence of infection is noted today, but you could develop an infection called a corneal ulcer or have some retained foreign body that may or may not have been noted today in the Emergency Department. Ulcers are not only painful, but they may also scar the cornea and cause permanent damage to the eye.   If you wear contact lenses, do not use them until your eye caregiver approves. Follow-up care is necessary to be sure the corneal abrasion is healing if not completely resolved in 2-3 days. See your caregiver or eye specialist as suggested for followup.   Follow-up instructions: Please follow-up with the opthalmologist listed in the next 2-3 days for  further evaluation of your symptoms.   Return instructions:   Please return to the Emergency Department if you experience worsening symptoms.   Please return immediately if you develop severe pain, pus drainage, new change in vision, or fever.  Please return if you have any other emergent concerns.  Additional Information:  Your vital signs today were: BP 117/75 (BP Location: Left Arm)    Pulse 89    Temp 98.6 F (37 C) (Oral)    Resp 20    SpO2 100%  If your blood pressure (BP) was elevated above 135/85 this visit, please have this repeated by your doctor within one month.

## 2016-08-03 NOTE — ED Notes (Signed)
ED Provider at bedside. 

## 2016-08-03 NOTE — ED Notes (Signed)
Pt was given a cold compress to apply to the left eye

## 2016-08-03 NOTE — ED Triage Notes (Signed)
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

## 2016-08-03 NOTE — ED Provider Notes (Signed)
[facility] Provider Note   CSN: [id number] Arrival date & time: 08/03/16  1446     History   Chief Complaint Chief Complaint  Patient presents with  . Foreign Body in Eye    HPI Paul Conway is a 30 y.o. male.  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 He thinks he may have gotten something into his eye but is unsure what. This occurred at approximately noon today. Eye was irrigated with eye drops. No other treatments prior to arrival. Patient has had a red eye and tearing. No other injuries. No history of eye problems or surgeries.      History reviewed. No pertinent past medical history.  There are no active problems to display for this patient.   History reviewed. No pertinent surgical history.     Home Medications    Prior to Admission medications   Medication Sig Start Date End Date Taking? Authorizing Provider  HYDROcodone-acetaminophen (NORCO/VICODIN) 5-325 MG tablet Take 1-2 tablets every 6 hours as needed for severe pain 08/03/16   Renne CriglerJoshua Zayaan Kozak, PA-C    Family History No family history on file.  Social History Social History  Substance Use Topics  . Smoking status: Never Smoker  . Smokeless tobacco: Never Used  . Alcohol use No     Allergies   Patient has no known allergies.   Review of Systems Review of Systems  Constitutional: Negative for fever.  HENT: Negative for congestion and rhinorrhea.   Eyes: Positive for pain, redness and visual disturbance. Negative for photophobia and itching.     Physical Exam Updated Vital Signs BP 117/75 (BP Location: Left Arm)   Pulse 89   Temp 98.6 F (37 C) (Oral)   Resp 20   SpO2 100%   Physical Exam  Constitutional: He appears well-developed and well-nourished.  HENT:  Head: Normocephalic and atraumatic.  Eyes: Pupils are equal, round, and reactive to light. Lids are everted and swept, no foreign bodies found.  Left eye exhibits no chemosis and no discharge. No foreign body present in the left eye. Right conjunctiva is injected. Right conjunctiva has no hemorrhage. Left conjunctiva is injected. Left conjunctiva has no hemorrhage. Right eye exhibits normal extraocular motion. Left eye exhibits normal extraocular motion.  Slit lamp exam:      The left eye shows fluorescein uptake. The left eye shows no hyphema and no anterior chamber bulge.    Neck: Normal range of motion. Neck supple.  Pulmonary/Chest: No respiratory distress.  Neurological: He is alert.  Skin: Skin is warm and dry.  Psychiatric: He has a normal mood and affect.  Nursing note and vitals reviewed.    ED Treatments / Results   Procedures Procedures (including critical care time)  Medications Ordered in ED Medications  fluorescein ophthalmic strip 1 strip (1 strip Left Eye Given 08/03/16 1533)  tetracaine (PONTOCAINE) 0.5 % ophthalmic solution 1 drop (1 drop Left Eye Given 08/03/16 1533)  erythromycin ophthalmic ointment ( Left Eye Given 08/03/16 1554)  HYDROcodone-acetaminophen (NORCO/VICODIN) 5-325 MG per tablet 1 tablet (1 tablet Oral Given 08/03/16 1551)     Initial Impression / Assessment and Plan / ED Course  I have reviewed the triage vital signs and the nursing notes.  Pertinent labs & imaging results that were available during my care of the patient were reviewed by me and considered in my medical decision making (see chart for details).  Clinical Course  Patient seen and examined. Work-up initiated. Medications ordered.   Vital signs reviewed and are as follows: BP 117/75 (BP Location: Left Arm)   Pulse 89   Temp 98.6 F (37 C) (Oral)   Resp 20   SpO2 100%   Two drops of tetracaine instilled into affected eye providing excellent pain relief.   Fluorescein strip applied to affected eye. Wood's lamp used to assess for corneal abrasion. Corneal abrasion identified. No foreign bodies noted. No visible  hyphema.   Patient tolerated procedure well without immediate complication.     Visual Acuity  Right Eye Distance:   Left Eye Distance:   Bilateral Distance:    Right Eye Near: R Near: 20/20 Left Eye Near:  L Near: 20/40 Bilateral Near:  20/20  Patient discharged home with erythromycin ointment and instructed on its use. Also given a short supply pain medication to use overnight tonight.  Patient counseled on use of narcotic pain medications. Counseled not to combine these medications with others containing tylenol. Urged not to drink alcohol, drive, or perform any other activities that requires focus while taking these medications. The patient verbalizes understanding and agrees with the plan.    Final Clinical Impressions(s) / ED Diagnoses   Final diagnoses:  Abrasion of left cornea, initial encounter   Patient with left corneal abrasion. No foreign bodies noted. No surrounding erythema, swelling, vision changes/loss suspicious for orbital or periorbital cellulitis. No signs of iritis. No signs of glaucoma. No symptoms of retinal detachment. No ophthalmologic emergency suspected. Outpatient referral given in case of no improvement.    New Prescriptions Discharge Medication List as of 08/03/2016  3:48 PM    START taking these medications   Details  HYDROcodone-acetaminophen (NORCO/VICODIN) 5-325 MG tablet Take 1-2 tablets every 6 hours as needed for severe pain, Print         Renne CriglerJoshua Kyna Blahnik, PA-C 08/03/16 1607    Melene Planan Floyd, DO 08/03/16 1633

## 2016-08-03 NOTE — ED Notes (Signed)
Family at bedside.wife

## 2016-10-09 IMAGING — DX DG FOOT COMPLETE 3+V*R*
3 series · 3 of 3 positions shown · non-contrast
Comparison: None.

CLINICAL DATA: Stepped on nail. Plantar foot puncture wound. Pain.
Initial encounter.

EXAM:
RIGHT FOOT COMPLETE - 3+ VIEW

[foot ap]
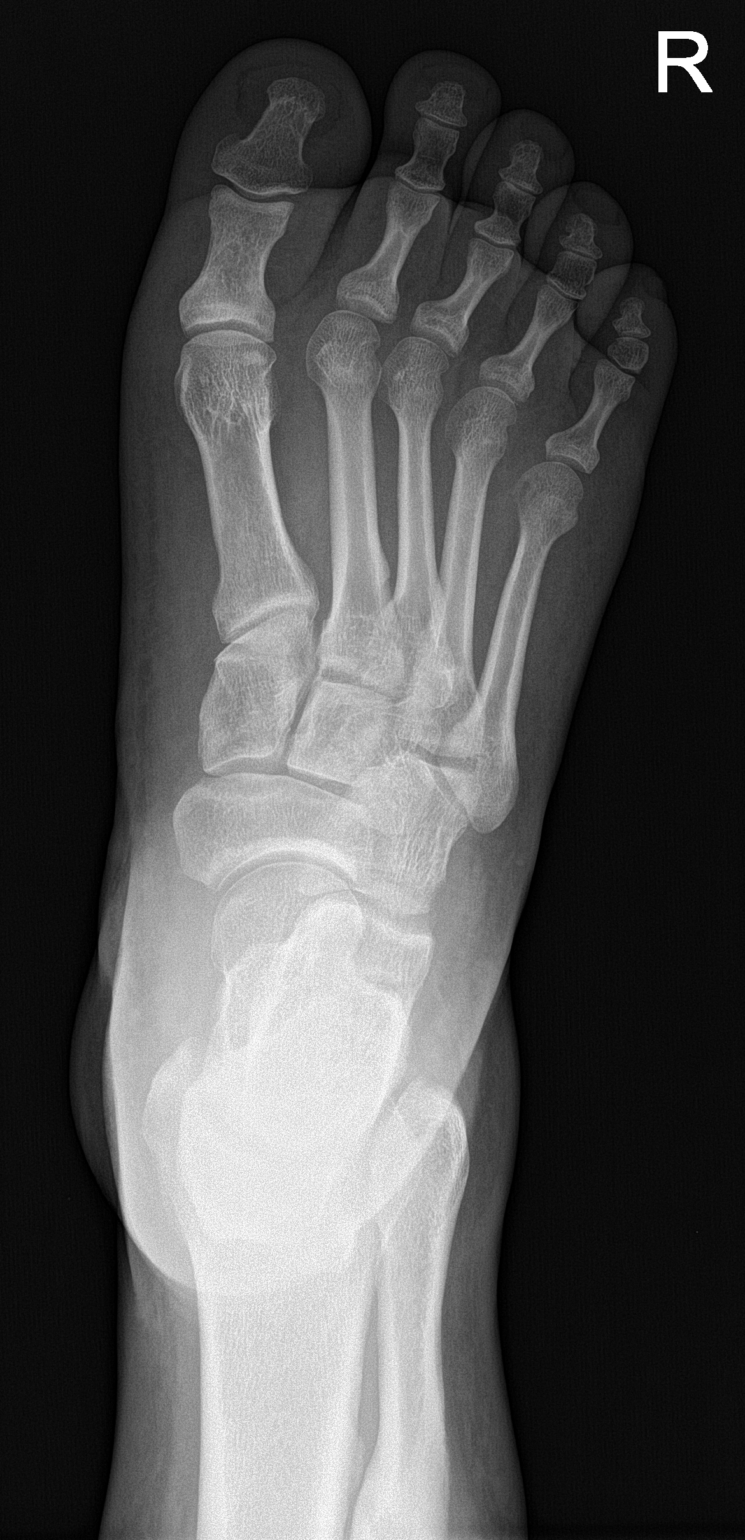

[foot obl]
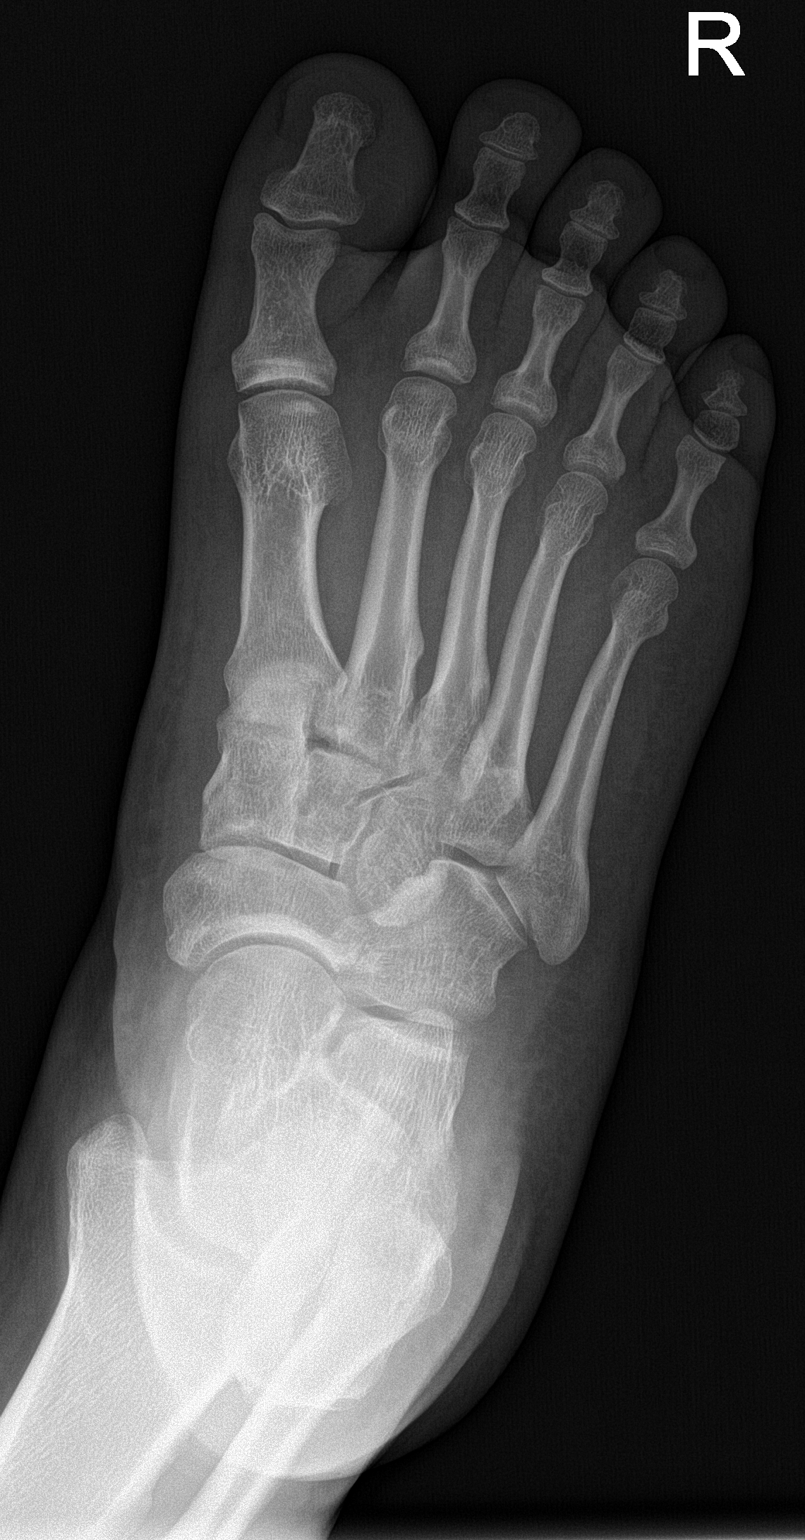

[foot lat]
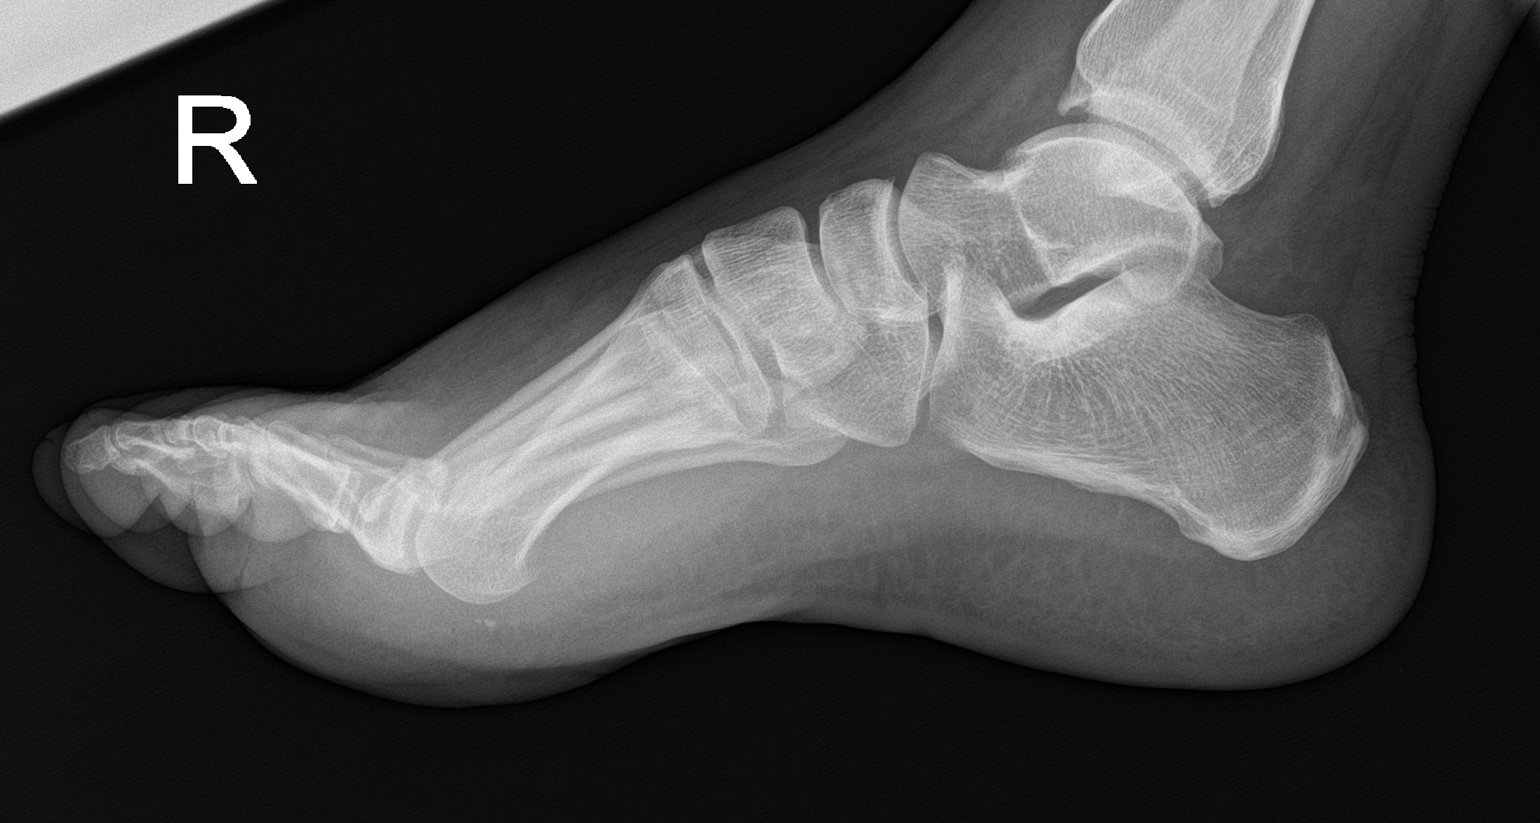

[3 of 3 positions shown; findings below may reference images not displayed]

FINDINGS: There is no evidence of fracture or dislocation. There is no
evidence of arthropathy or other focal bone abnormality. Soft
tissues are unremarkable. No evidence of radiopaque foreign body.
IMPRESSION: Negative.

## 2016-12-16 ENCOUNTER — Emergency Department (HOSPITAL_BASED_OUTPATIENT_CLINIC_OR_DEPARTMENT_OTHER): Payer: Self-pay

## 2016-12-16 ENCOUNTER — Emergency Department (HOSPITAL_BASED_OUTPATIENT_CLINIC_OR_DEPARTMENT_OTHER)
Admission: EM | Admit: 2016-12-16 | Discharge: 2016-12-16 | Disposition: A | Discharge status: Home / Self Care | Payer: Self-pay | Attending: Emergency Medicine | Admitting: Emergency Medicine

## 2016-12-16 ENCOUNTER — Encounter (HOSPITAL_BASED_OUTPATIENT_CLINIC_OR_DEPARTMENT_OTHER): Payer: Self-pay | Admitting: *Deleted

## 2016-12-16 DIAGNOSIS — Y9389 Activity, other specified: Secondary | ICD-10-CM | POA: Insufficient documentation

## 2016-12-16 DIAGNOSIS — S39012A Strain of muscle, fascia and tendon of lower back, initial encounter: Secondary | ICD-10-CM | POA: Insufficient documentation

## 2016-12-16 DIAGNOSIS — Y999 Unspecified external cause status: Secondary | ICD-10-CM | POA: Insufficient documentation

## 2016-12-16 DIAGNOSIS — Y929 Unspecified place or not applicable: Secondary | ICD-10-CM | POA: Insufficient documentation

## 2016-12-16 DIAGNOSIS — X501XXA Overexertion from prolonged static or awkward postures, initial encounter: Secondary | ICD-10-CM | POA: Insufficient documentation

## 2016-12-16 LAB — URINALYSIS, ROUTINE W REFLEX MICROSCOPIC
BILIRUBIN URINE: NEGATIVE
GLUCOSE, UA: NEGATIVE mg/dL
HGB URINE DIPSTICK: NEGATIVE
Ketones, ur: NEGATIVE mg/dL
Leukocytes, UA: NEGATIVE
Nitrite: NEGATIVE
PROTEIN: NEGATIVE mg/dL
Specific Gravity, Urine: 1.015 (ref 1.005–1.030)
pH: 6 (ref 5.0–8.0)

## 2016-12-16 MED ORDER — TRAMADOL HCL 50 MG PO TABS: 50.0000 mg | ORAL_TABLET | Freq: Four times a day (QID) | ORAL | 0 refills | Status: AC | PRN

## 2016-12-16 MED ORDER — TRAMADOL HCL 50 MG PO TABS
50.0000 mg | ORAL_TABLET | Freq: Once | ORAL | Status: AC
  Administered 2016-12-16: 50 mg via ORAL
  Filled 2016-12-16: qty 1

## 2016-12-16 MED ORDER — IBUPROFEN 400 MG PO TABS
600.0000 mg | ORAL_TABLET | Freq: Once | ORAL | Status: AC
  Administered 2016-12-16: 600 mg via ORAL
  Filled 2016-12-16: qty 1

## 2016-12-16 MED ORDER — METHOCARBAMOL 500 MG PO TABS
1000.0000 mg | ORAL_TABLET | Freq: Three times a day (TID) | ORAL | 0 refills | Status: AC | PRN
Start: 2016-12-16 — End: ?

## 2016-12-16 MED FILL — traMADol HCL 50 MG TABS: 50 | 4 days supply | Qty: 15 | Fill #0

## 2016-12-16 MED FILL — METHOCARBAMOL 500 MG TABLET: 500 | 4 days supply | Qty: 20 | Fill #0

## 2016-12-16 NOTE — ED Triage Notes (Signed)
Pt understands some English -- Spanish is primary language. Pt prefers wife at bedside as interpreter as opposed to iPad interpreter. Pt reports low and mid back pain since this past Sunday. Home treatments of massage and creams and Tylenol with no effect. Denies known injury, n/v/d, genitourinary symptoms. Reports chills and sweats last night.

## 2016-12-16 NOTE — ED Provider Notes (Signed)
[facility] Provider Note   CSN: [id number] Arrival date & time: 12/16/16  4098     History   Chief Complaint Chief Complaint  Patient presents with  . Back Pain    HPI Paul Conway is a 31 y.o. male.  Patient c/o bilateral low back pain for the past 5 days. Pain constant, mod-severe, persistent. Worse w certain movements, bending at waist. Denies specific injury or strain. Does work Physicist, medical. Onset at rest, while travelling in car. No radiation of pain. No leg pain/radicular pain. No numbness/weakness. No dysuria or hematuria. No anterior pain, no abd or pelvic pain. Denies cough or uri c/o. No cp or sob. No fever or chills, although felt hot last night. No hx kidney stones. No hx ddd.    The history is provided by the patient and the spouse. A language interpreter was used.  Back Pain   Pertinent negatives include no chest pain, no fever, no numbness, no headaches, no abdominal pain, no dysuria and no weakness.    History reviewed. No pertinent past medical history.  There are no active problems to display for this patient.   History reviewed. No pertinent surgical history.     Home Medications    Prior to Admission medications   Medication Sig Start Date End Date Taking? Authorizing Provider  HYDROcodone-acetaminophen (NORCO/VICODIN) 5-325 MG tablet Take 1-2 tablets every 6 hours as needed for severe pain 08/03/16   Renne Crigler, PA-C    Family History No family history on file.  Social History Social History  Substance Use Topics  . Smoking status: Never Smoker  . Smokeless tobacco: Never Used  . Alcohol use No     Allergies   Patient has no known allergies.   Review of Systems Review of Systems  Constitutional: Negative for fever.  Respiratory: Negative for cough and shortness of breath.   Cardiovascular: Negative for chest pain and leg swelling.  Gastrointestinal: Negative for abdominal pain, diarrhea and vomiting.    Genitourinary: Negative for dysuria and hematuria.  Musculoskeletal: Positive for back pain. Negative for neck pain.  Skin: Negative for rash.  Neurological: Negative for weakness, numbness and headaches.  Hematological: Does not bruise/bleed easily.  Psychiatric/Behavioral: Negative for confusion.     Physical Exam Updated Vital Signs BP 110/74 (BP Location: Right Arm)   Pulse 76   Temp 98.7 F (37.1 C) (Oral)   Resp 16   Ht  (1.702 m)   Wt 108.9 kg   SpO2 99%   BMI 37.59 kg/m   Physical Exam  Constitutional: He appears well-developed and well-nourished. No distress.  HENT:  Head: Atraumatic.  Eyes: Conjunctivae are normal.  Neck: Neck supple. No tracheal deviation present.  Cardiovascular: Normal rate, regular rhythm, normal heart sounds and intact distal pulses.  Exam reveals no gallop and no friction rub.   No murmur heard. Pulmonary/Chest: Effort normal and breath sounds normal. No accessory muscle usage. No respiratory distress.  Abdominal: Soft. Bowel sounds are normal. He exhibits no distension and no mass. There is no tenderness.  Genitourinary:  Genitourinary Comments: No cva tenderness  Musculoskeletal: He exhibits no edema.  Diffuse lumbar tenderness, including midline and bil paraspinal muscular tenderness, otherwise, TLS spine non tender, aligned. No sts or mass felt. No skin changes or lesions.   Neurological: He is alert.  Motor intact bil lower ext, stre 5/5. sens grossly intact. Steady gait. Straight leg raise neg.   Skin: Skin is warm and dry. No rash noted. He  is not diaphoretic.  Psychiatric: He has a normal mood and affect.  Nursing note and vitals reviewed.    ED Treatments / Results  Labs (all labs ordered are listed, but only abnormal results are displayed) . Results for orders placed or performed during the hospital encounter of 12/16/16  Urinalysis, Routine w reflex microscopic  Result Value Ref Range   Color, Urine YELLOW YELLOW    APPearance CLEAR CLEAR   Specific Gravity, Urine 1.015 1.005 - 1.030   pH 6.0 5.0 - 8.0   Glucose, UA NEGATIVE NEGATIVE mg/dL   Hgb urine dipstick NEGATIVE NEGATIVE   Bilirubin Urine NEGATIVE NEGATIVE   Ketones, ur NEGATIVE NEGATIVE mg/dL   Protein, ur NEGATIVE NEGATIVE mg/dL   Nitrite NEGATIVE NEGATIVE   Leukocytes, UA NEGATIVE NEGATIVE   Dg Lumbar Spine Complete  Result Date: 12/16/2016 CLINICAL DATA:  Acute low back pain without known injury. EXAM: LUMBAR SPINE - COMPLETE 4+ VIEW COMPARISON:  None. FINDINGS: There is no evidence of lumbar spine fracture. Alignment is normal. Intervertebral disc spaces are maintained. IMPRESSION: Normal lumbar spine. Electronically Signed   By: Lupita Raider, M.D.   On: 12/16/2016 10:38    EKG  EKG Interpretation None       Radiology Dg Lumbar Spine Complete  Result Date: 12/16/2016 CLINICAL DATA:  Acute low back pain without known injury. EXAM: LUMBAR SPINE - COMPLETE 4+ VIEW COMPARISON:  None. FINDINGS: There is no evidence of lumbar spine fracture. Alignment is normal. Intervertebral disc spaces are maintained. IMPRESSION: Normal lumbar spine. Electronically Signed   By: Lupita Raider, M.D.   On: 12/16/2016 10:38    Procedures Procedures (including critical care time)  Medications Ordered in ED Medications  ibuprofen (ADVIL,MOTRIN) tablet 600 mg (not administered)  traMADol (ULTRAM) tablet 50 mg (not administered)     Initial Impression / Assessment and Plan / ED Course  I have reviewed the triage vital signs and the nursing notes.  Pertinent labs & imaging results that were available during my care of the patient were reviewed by me and considered in my medical decision making (see chart for details).  Pt has ride, does not have to drive.  No meds yet today.  Motrin po, ultram po.  Recheck pt comfortable. ua neg, xr neg.  Feel most likely symptoms musculoskeletal in etiology. Pt appears stable for d/c.     Final  Clinical Impressions(s) / ED Diagnoses   Final diagnoses:  None    New Prescriptions New Prescriptions   No medications on file     Cathren Laine, MD 12/16/16 1115

## 2016-12-16 NOTE — ED Notes (Signed)
ED Provider at bedside. 

## 2016-12-16 NOTE — Discharge Instructions (Signed)
It was our pleasure to provide your ER care today - we hope that you feel better.  Rest. Avoid heavy lifting > 20 lbs, or bending at waist for the next week.  You may try heat to sore area.   Take motrin or aleve as need for pain.  You can also take robaxin as need for muscle spasm, and ultram as need for pain - these medications cause drowsiness, no driving when taking.  Follow up with primary care doctor in 1 week if symptoms fail to improve/resolve.  Return to ER if worse, new symptoms, high fevers, intractable pain, other concern.

## 2016-12-16 NOTE — ED Notes (Signed)
Patient transported to X-ray 

## 2020-06-17 ENCOUNTER — Other Ambulatory Visit: Payer: Self-pay

## 2020-06-17 ENCOUNTER — Encounter (HOSPITAL_BASED_OUTPATIENT_CLINIC_OR_DEPARTMENT_OTHER): Payer: Self-pay | Admitting: *Deleted

## 2020-06-17 ENCOUNTER — Emergency Department (HOSPITAL_BASED_OUTPATIENT_CLINIC_OR_DEPARTMENT_OTHER)
Admission: EM | Admit: 2020-06-17 | Discharge: 2020-06-17 | Disposition: A | Discharge status: Home / Self Care | Payer: HRSA Program | Attending: Emergency Medicine | Admitting: Emergency Medicine

## 2020-06-17 DIAGNOSIS — Z20822 Contact with and (suspected) exposure to covid-19: Secondary | ICD-10-CM | POA: Diagnosis not present

## 2020-06-17 DIAGNOSIS — R059 Cough, unspecified: Secondary | ICD-10-CM | POA: Diagnosis present

## 2020-06-17 DIAGNOSIS — J069 Acute upper respiratory infection, unspecified: Secondary | ICD-10-CM | POA: Insufficient documentation

## 2020-06-17 LAB — RESPIRATORY PANEL BY RT PCR (FLU A&B, COVID)
Influenza A by PCR: NEGATIVE
Influenza B by PCR: NEGATIVE
SARS Coronavirus 2 by RT PCR: NEGATIVE

## 2020-06-17 NOTE — ED Triage Notes (Signed)
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

## 2020-06-17 NOTE — ED Provider Notes (Signed)
[facility] EMERGENCY DEPARTMENT Provider Note   CSN: [id number] Arrival date & time: 06/17/20  1844     History Chief Complaint  Patient presents with  . Cough    Paul Conway is a 34 y.o. male.  34 year old healthy male who presents with cough.  Yesterday the patient began having a cough associated with runny nose, subjective fevers although measured temp was normal, and one episode of vomiting this morning.  No diarrhea or sore throat.  No sick contacts.  No breathing problems.  Spouse used Vicks VapoRub last night.  The history is provided by the patient and the spouse.  Cough      History reviewed. No pertinent past medical history.  There are no problems to display for this patient.   History reviewed. No pertinent surgical history.     No family history on file.  Social History   Tobacco Use  . Smoking status: Never Smoker  . Smokeless tobacco: Never Used  Vaping Use  . Vaping Use: Never used  Substance Use Topics  . Alcohol use: No  . Drug use: No    Home Medications Prior to Admission medications   Medication Sig Start Date End Date Taking? Authorizing Provider  HYDROcodone-acetaminophen (NORCO/VICODIN) 5-325 MG tablet Take 1-2 tablets every 6 hours as needed for severe pain 08/03/16   Renne Crigler, PA-C  methocarbamol (ROBAXIN) 500 MG tablet Take 2 tablets (1,000 mg total) by mouth 3 (three) times daily as needed for muscle spasms. 12/16/16   Cathren Laine, MD  traMADol (ULTRAM) 50 MG tablet Take 1 tablet (50 mg total) by mouth every 6 (six) hours as needed. 12/16/16   Cathren Laine, MD    Allergies    Patient has no known allergies.  Review of Systems   Review of Systems  Respiratory: Positive for cough.    All other systems reviewed and are negative except that which was mentioned in HPI  Physical Exam Updated Vital Signs BP (!) 120/94 (BP Location: Left Arm)   Pulse 90   Temp 99.2 F (37.3 C) (Oral)   Resp 18   Ht 5\' 6"   (1.676 m)   Wt 79.4 kg   SpO2 99%   BMI 28.25 kg/m   Physical Exam Vitals and nursing note reviewed.  Constitutional:      General: He is not in acute distress.    Appearance: He is well-developed.  HENT:     Head: Normocephalic and atraumatic.  Eyes:     Conjunctiva/sclera: Conjunctivae normal.  Cardiovascular:     Rate and Rhythm: Normal rate and regular rhythm.     Heart sounds: Normal heart sounds. No murmur heard.   Pulmonary:     Effort: Pulmonary effort is normal.     Breath sounds: Normal breath sounds. No wheezing.  Abdominal:     General: There is no distension.     Palpations: Abdomen is soft.     Tenderness: There is no abdominal tenderness.  Musculoskeletal:     Cervical back: Neck supple.     Right lower leg: No edema.     Left lower leg: No edema.  Skin:    General: Skin is warm and dry.  Neurological:     Mental Status: He is alert and oriented to person, place, and time.     Comments: Fluent speech  Psychiatric:        Judgment: Judgment normal.     ED Results / Procedures / Treatments   Labs (all  labs ordered are listed, but only abnormal results are displayed) Labs Reviewed  RESPIRATORY PANEL BY RT PCR (FLU A&B, COVID)    EKG None  Radiology No results found.  Procedures Procedures (including critical care time)  Medications Ordered in ED Medications - No data to display  ED Course  I have reviewed the triage vital signs and the nursing notes.  Pertinent labs that were available during my care of the patient were reviewed by me and considered in my medical decision making (see chart for details).    MDM Rules/Calculators/A&P                          Well-appearing on exam, normal vital signs, normal O2 saturation on room air.  PCR negative for Covid, influenza, and RSV.  Discussed supportive measures for viral URI. Final Clinical Impression(s) / ED Diagnoses Final diagnoses:  Viral URI with cough    Rx / DC Orders ED  Discharge Orders    None       Deaundre Allston, Ambrose Finland, MD 06/17/20 2126

## 2020-06-17 NOTE — ED Notes (Signed)
Triage by Theresia Bough, RN wrong chart-see triage by Delorse Limber, RN

## 2020-06-17 NOTE — ED Notes (Signed)
Discharge instructions discussed with patient and translator. Verbalized understanding. Departs ED with VSS and in NAD.

## 2023-09-12 ENCOUNTER — Encounter (HOSPITAL_BASED_OUTPATIENT_CLINIC_OR_DEPARTMENT_OTHER): Payer: Self-pay | Admitting: Emergency Medicine

## 2023-09-12 ENCOUNTER — Other Ambulatory Visit: Payer: Self-pay

## 2023-09-12 ENCOUNTER — Emergency Department (HOSPITAL_BASED_OUTPATIENT_CLINIC_OR_DEPARTMENT_OTHER): Payer: Self-pay

## 2023-09-12 ENCOUNTER — Emergency Department (HOSPITAL_BASED_OUTPATIENT_CLINIC_OR_DEPARTMENT_OTHER)
Admission: EM | Admit: 2023-09-12 | Discharge: 2023-09-12 | Disposition: A | Discharge status: Home / Self Care | Payer: Self-pay | Attending: Emergency Medicine | Admitting: Emergency Medicine

## 2023-09-12 DIAGNOSIS — J101 Influenza due to other identified influenza virus with other respiratory manifestations: Secondary | ICD-10-CM

## 2023-09-12 DIAGNOSIS — Z20822 Contact with and (suspected) exposure to covid-19: Secondary | ICD-10-CM | POA: Insufficient documentation

## 2023-09-12 DIAGNOSIS — J09X2 Influenza due to identified novel influenza A virus with other respiratory manifestations: Secondary | ICD-10-CM | POA: Insufficient documentation

## 2023-09-12 LAB — RESP PANEL BY RT-PCR (RSV, FLU A&B, COVID)  RVPGX2
Influenza A by PCR: POSITIVE — AB
Influenza B by PCR: NEGATIVE
Resp Syncytial Virus by PCR: NEGATIVE
SARS Coronavirus 2 by RT PCR: NEGATIVE

## 2023-09-12 LAB — GROUP A STREP BY PCR: Group A Strep by PCR: NOT DETECTED

## 2023-09-12 MED ORDER — ACETAMINOPHEN 325 MG PO TABS
650.0000 mg | ORAL_TABLET | Freq: Once | ORAL | Status: AC | PRN
  Administered 2023-09-12: 650 mg via ORAL
  Filled 2023-09-12: qty 2

## 2023-09-12 NOTE — ED Triage Notes (Signed)
Pt reports cough, chills, body aches, fever, sore throat for 4d

## 2023-09-12 NOTE — ED Provider Notes (Signed)
[facility] EMERGENCY DEPARTMENT AT [facility] Provider Note   CSN: [id number] Arrival date & time: 09/12/23  1816     History  Chief Complaint  Patient presents with   Cough    Paul Conway is a 38 y.o. male presenting to the emerged ferment 4 days of cough, congestion, headaches, fatigue.  Multiple sick family members in the house.  HPI     Home Medications Prior to Admission medications   Medication Sig Start Date End Date Taking? Authorizing Provider  HYDROcodone-acetaminophen (NORCO/VICODIN) 5-325 MG tablet Take 1-2 tablets every 6 hours as needed for severe pain 08/03/16   Renne Crigler, PA-C  methocarbamol (ROBAXIN) 500 MG tablet Take 2 tablets (1,000 mg total) by mouth 3 (three) times daily as needed for muscle spasms. 12/16/16   Cathren Laine, MD  traMADol (ULTRAM) 50 MG tablet Take 1 tablet (50 mg total) by mouth every 6 (six) hours as needed. 12/16/16   Cathren Laine, MD      Allergies    Patient has no known allergies.    Review of Systems   Review of Systems  Physical Exam Updated Vital Signs BP (!) 129/95 (BP Location: Left Arm)   Pulse (!) 104   Temp (!) 100.9 F (38.3 C)   Resp 18   Ht 5\' 6"  (1.676 m)   Wt 116.3 kg   SpO2 100%   BMI 41.40 kg/m  Physical Exam Constitutional:      General: He is not in acute distress. HENT:     Head: Normocephalic and atraumatic.  Eyes:     Conjunctiva/sclera: Conjunctivae normal.     Pupils: Pupils are equal, round, and reactive to light.  Cardiovascular:     Rate and Rhythm: Normal rate and regular rhythm.  Pulmonary:     Effort: Pulmonary effort is normal. No respiratory distress.  Abdominal:     General: There is no distension.     Tenderness: There is no abdominal tenderness.  Skin:    General: Skin is warm and dry.  Neurological:     General: No focal deficit present.     Mental Status: He is alert. Mental status is at baseline.  Psychiatric:        Mood and Affect: Mood normal.         Behavior: Behavior normal.     ED Results / Procedures / Treatments   Labs (all labs ordered are listed, but only abnormal results are displayed) Labs Reviewed  RESP PANEL BY RT-PCR (RSV, FLU A&B, COVID)  RVPGX2 - Abnormal; Notable for the following components:      Result Value   Influenza A by PCR POSITIVE (*)    All other components within normal limits  GROUP A STREP BY PCR    EKG None  Radiology DG Chest 2 View Result Date: 09/12/2023 CLINICAL DATA:  Cough, chills and body aches. EXAM: CHEST - 2 VIEW COMPARISON:  None Available. FINDINGS: Underinflation. No pneumothorax, effusion or edema. Subtle lung base opacity. Possible infiltrate. Normal cardiopericardial silhouette. IMPRESSION: Underinflation. Subtle lung base opacity. Possible infiltrate. Recommend follow-up. Electronically Signed   By: Karen Kays M.D.   On: 09/12/2023 19:15    Procedures Procedures    Medications Ordered in ED Medications  acetaminophen (TYLENOL) tablet 650 mg (650 mg Oral Given 09/12/23 1840)    ED Course/ Medical Decision Making/ A&P  Medical Decision Making Amount and/or Complexity of Data Reviewed Radiology: ordered.  Risk OTC drugs.   Patient is presenting with viral type syndrome.  He is positive for influenza.  Strongly suspect is the cause of his symptoms.  I personally viewed interpret his x-ray, I do not see evidence of clear infiltrate.  No indication for antibiotics.  He is not hypoxic.  He does not appear clinically dehydrated.  He is to make an effort to drink a lot of water, which I commended him for.  He can continue over-the-counter medications at home.  His wife is present at the bedside provide supplemental history.  I repeatedly offered to use a Engineer, structural but he refused.        Final Clinical Impression(s) / ED Diagnoses Final diagnoses:  Influenza A    Rx / DC Orders ED Discharge Orders     None          Terald Sleeper, MD 09/12/23 [phone]
